# Patient Record
Sex: Female | Born: 1950 | Race: White | Hispanic: No | Marital: Single | Smoking: Former smoker
Health system: Southern US, Community
[De-identification: ages and names within clinical notes are randomized; demographics above are authoritative.]

## PROBLEM LIST (undated history)

## (undated) DIAGNOSIS — I10 Essential (primary) hypertension: Secondary | ICD-10-CM

## (undated) DIAGNOSIS — E785 Hyperlipidemia, unspecified: Secondary | ICD-10-CM

## (undated) DIAGNOSIS — G909 Disorder of the autonomic nervous system, unspecified: Secondary | ICD-10-CM

## (undated) HISTORY — PX: TONSILLECTOMY: SUR1361

## (undated) HISTORY — DX: Essential (primary) hypertension: I10

## (undated) HISTORY — DX: Disorder of the autonomic nervous system, unspecified: G90.9

## (undated) HISTORY — PX: OTHER SURGICAL HISTORY: SHX169

## (undated) HISTORY — DX: Hyperlipidemia, unspecified: E78.5

---

## 2007-01-19 ENCOUNTER — Other Ambulatory Visit: Payer: Self-pay

## 2007-01-19 ENCOUNTER — Emergency Department: Payer: Self-pay | Admitting: Emergency Medicine

## 2008-04-25 ENCOUNTER — Ambulatory Visit: Payer: Self-pay | Admitting: Internal Medicine

## 2008-08-01 ENCOUNTER — Ambulatory Visit: Payer: Self-pay | Admitting: Unknown Physician Specialty

## 2008-09-24 ENCOUNTER — Ambulatory Visit: Payer: Self-pay | Admitting: Unknown Physician Specialty

## 2008-11-05 ENCOUNTER — Ambulatory Visit: Payer: Self-pay | Admitting: Unknown Physician Specialty

## 2008-11-07 ENCOUNTER — Ambulatory Visit: Payer: Self-pay | Admitting: Specialist

## 2008-12-11 ENCOUNTER — Ambulatory Visit: Payer: Self-pay | Admitting: Specialist

## 2009-01-30 ENCOUNTER — Ambulatory Visit: Payer: Self-pay | Admitting: Internal Medicine

## 2009-03-13 ENCOUNTER — Ambulatory Visit: Payer: Self-pay | Admitting: Specialist

## 2009-11-13 ENCOUNTER — Ambulatory Visit: Payer: Self-pay | Admitting: Specialist

## 2010-04-09 ENCOUNTER — Ambulatory Visit: Payer: Self-pay | Admitting: Internal Medicine

## 2010-08-22 IMAGING — CT CT CHEST W/ CM
1 series · 15 of 31 positions shown, 19 images · non-contrast
Comparison: none

REASON FOR EXAM: Pulmonary Nodules
COMMENTS:

[Series 2: soft tissue · axial · 0.65mm/px · z∈[-154,+171]mm · 15 of 71 slices shown, 19 images]
[im 3/71  mediastinal]
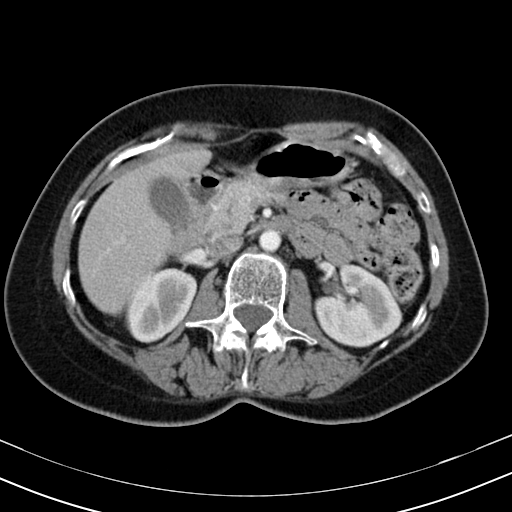
[im 3/71  lung]
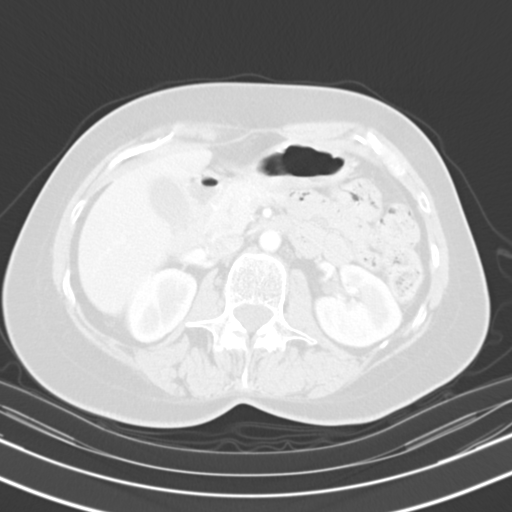
[im 8/71  lung]
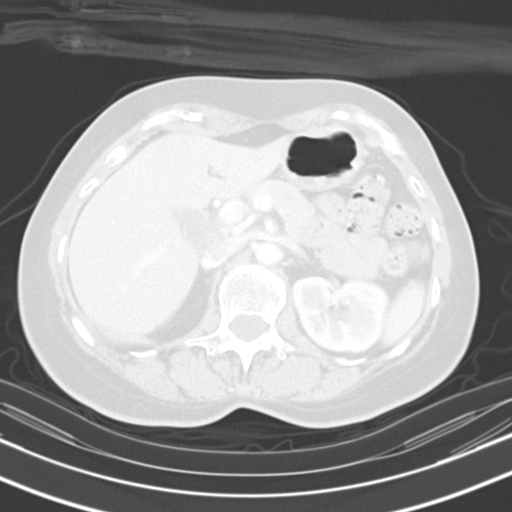
[im 13/71  lung]
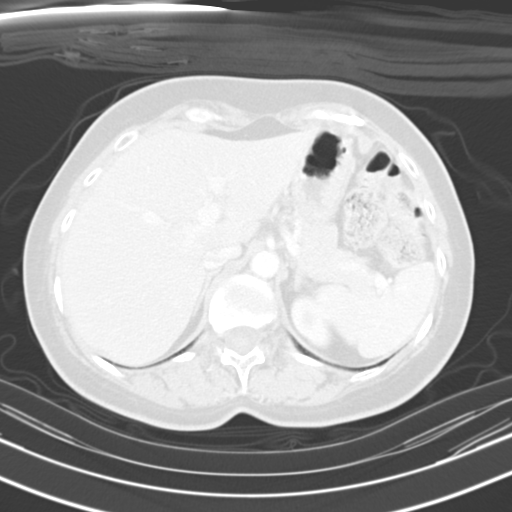
[im 16/71  lung]
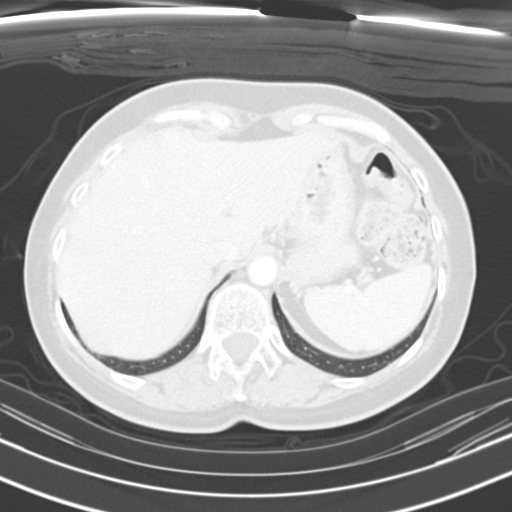
[im 21/71  mediastinal]
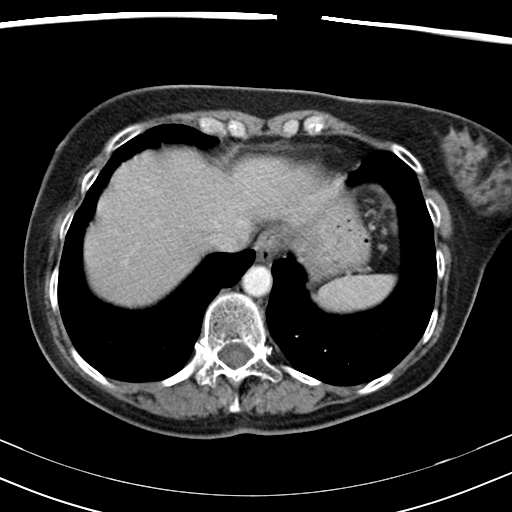
[im 21/71  lung]
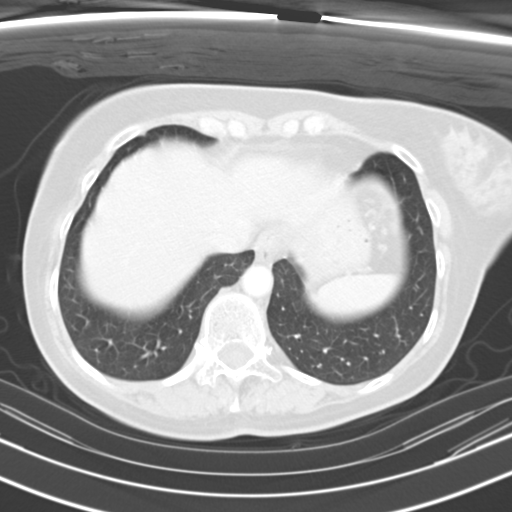
[im 26/71  lung]
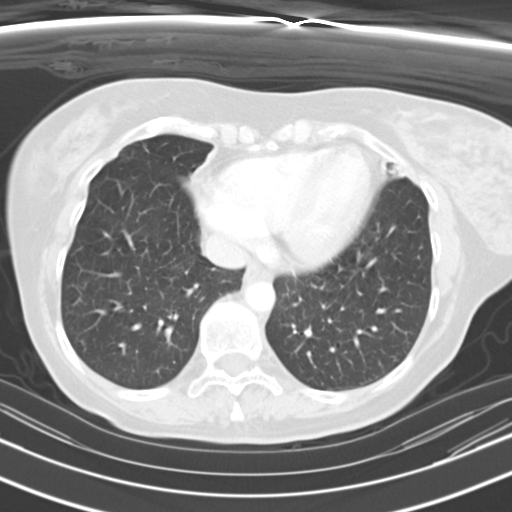
[im 32/71  lung]
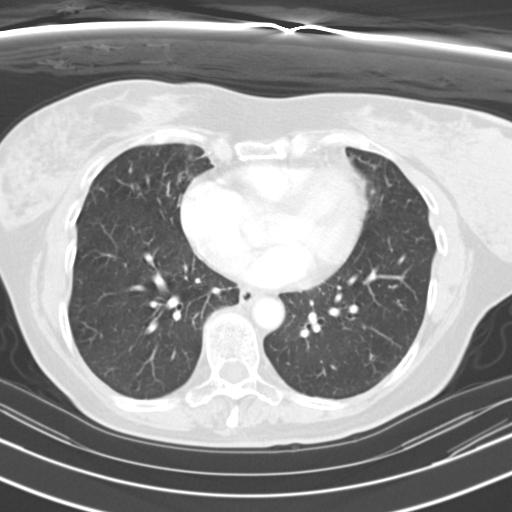
[im 37/71  lung]
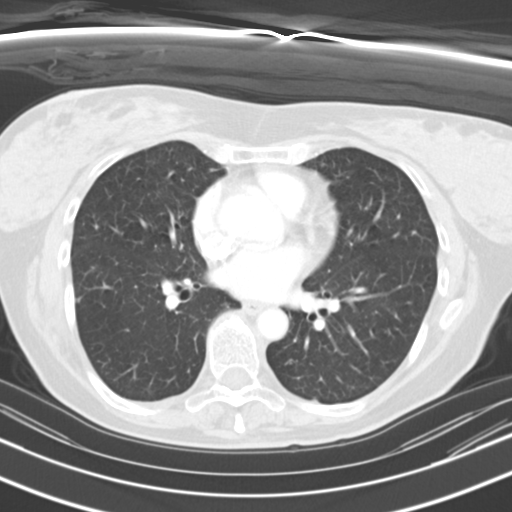
[im 42/71  mediastinal]
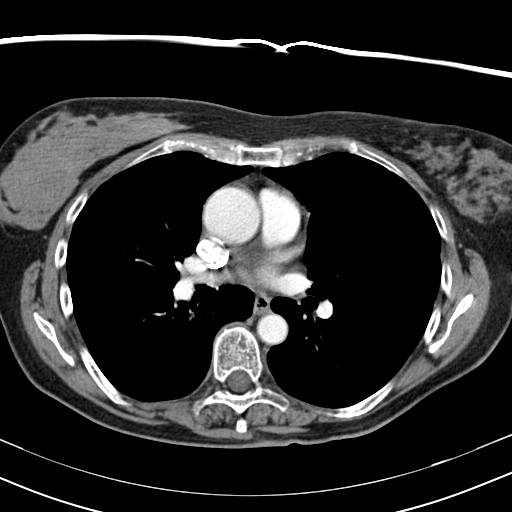
[im 42/71  lung]
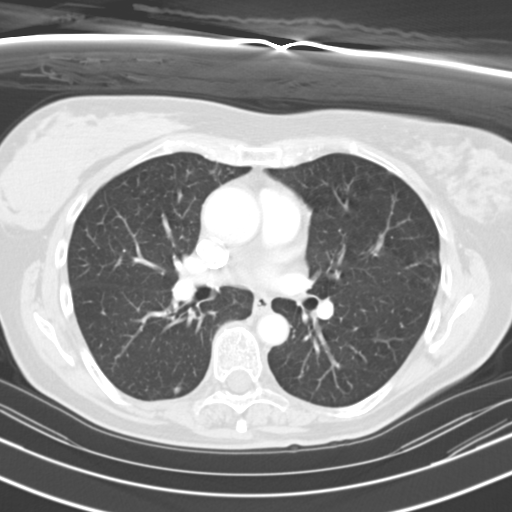
[im 45/71  lung]
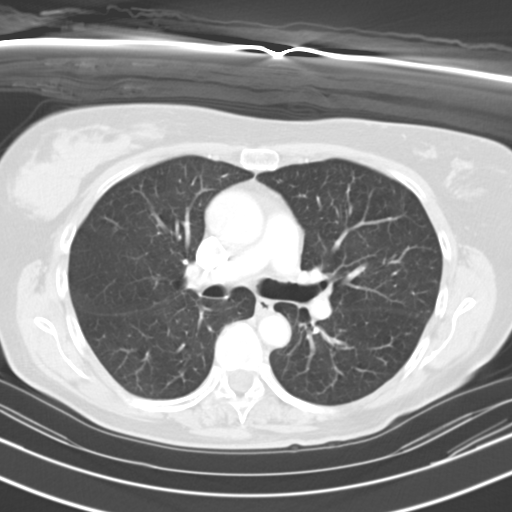
[im 50/71  lung]
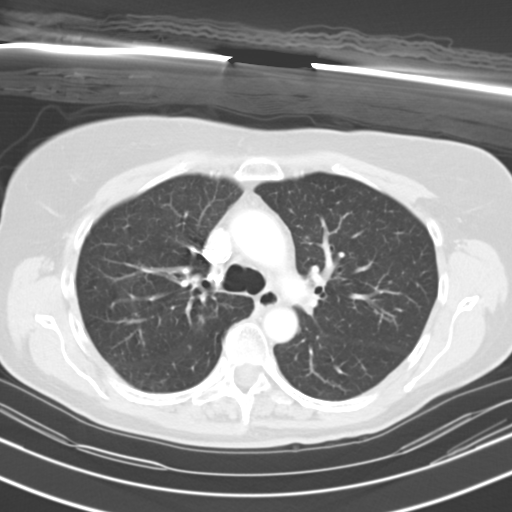
[im 55/71  lung]
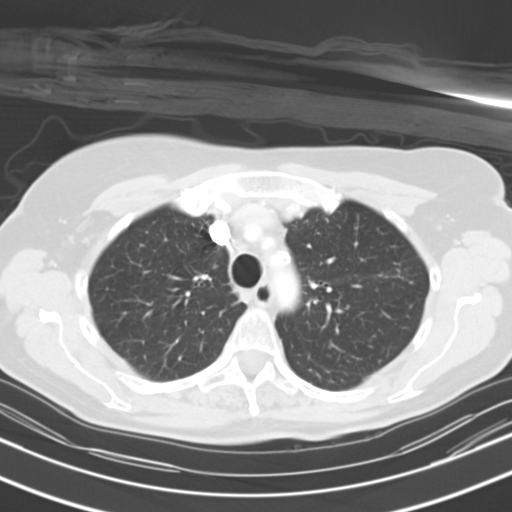
[im 58/71  mediastinal]
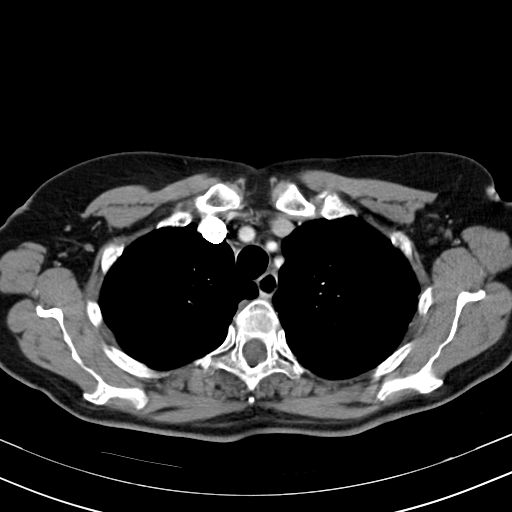
[im 58/71  lung]
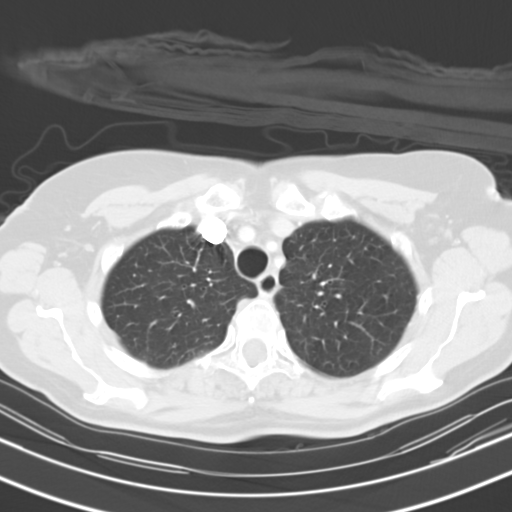
[im 63/71  lung]
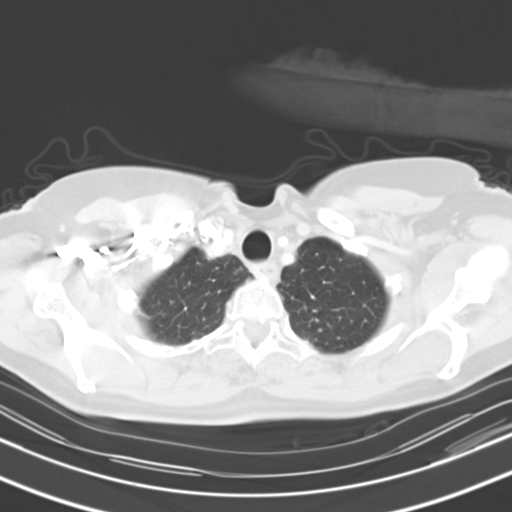
[im 68/71  lung]
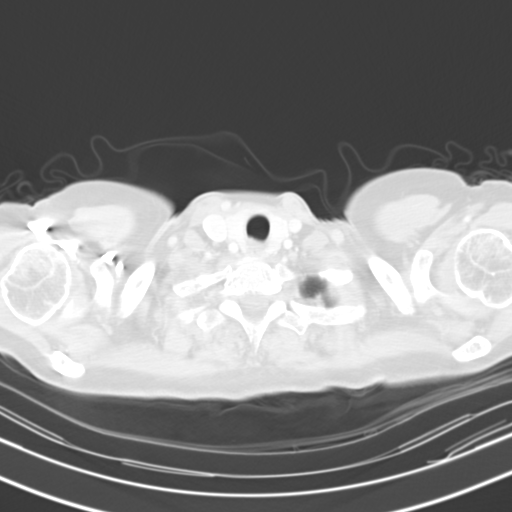

[15 of 31 positions shown; findings below may reference images not displayed]

PROCEDURE:     MIMI - MIMI CHEST WITH CONTRAST  - November 13, 2009 [DATE]

RESULT:     Axial CT scanning was performed through the chest at 5 mm
intervals and slice thicknesses following intravenous administration of 75
cc of 2sovue-2LO. Review of 3-dimensional reconstructed images was performed
separately on the [employer] monitor. Comparison is made to the study

At lung window settings on image 39 there is a stable tiny subpleural nodule
in the left lower lobe. A second nodule on image 48 in the left lower lobe
laterally is present and stable. On the right on image 30 posteriorly in the
right lower lobe there is a stable proximally 5 mm diameter soft tissue
density nodule. There are other subcentimeter nodules present which appear
stable as well. There is no alveolar infiltrate. Minimally increased density
is noted in the medial aspect of the right middle lobe which has appeared
since the previous study. This is seen on images 40 through a 46. This
likely reflects atelectasis.

 The cardiac chambers are normal in size. The caliber of the thoracic aorta
is normal. There is no pleural nor pericardial effusion. I see no pathologic
sized mediastinal or hilar lymph nodes. Within the upper abdomen the
observed portions of the liver and spleen appear normal. There are no
adrenal masses.
IMPRESSION: 1. There is are numerous subcentimeter pulmonary parenchymal nodules
bilaterally which are stable.
2. There is a new area of subtle increased density in the medial aspect of
the right middle lobe which likely reflects atelectasis.
3. I do not see evidence of CHF nor of mediastinal or hilar lymphadenopathy.

## 2012-01-19 ENCOUNTER — Ambulatory Visit: Payer: Self-pay | Admitting: Otolaryngology

## 2012-02-09 ENCOUNTER — Ambulatory Visit: Payer: Self-pay | Admitting: Otolaryngology

## 2020-07-23 ENCOUNTER — Ambulatory Visit (INDEPENDENT_AMBULATORY_CARE_PROVIDER_SITE_OTHER): Payer: Medicare Other | Admitting: Internal Medicine

## 2020-07-23 DIAGNOSIS — G4733 Obstructive sleep apnea (adult) (pediatric): Secondary | ICD-10-CM | POA: Diagnosis not present

## 2020-07-23 DIAGNOSIS — Z7189 Other specified counseling: Secondary | ICD-10-CM | POA: Diagnosis not present

## 2020-07-23 DIAGNOSIS — Z9989 Dependence on other enabling machines and devices: Secondary | ICD-10-CM

## 2020-07-23 NOTE — Patient Instructions (Signed)

## 2020-07-23 NOTE — Progress Notes (Signed)
[facility] [address] [place], [place] [address]  Pulmonary Sleep Medicine   Office Visit Note  Patient Name: Tara Swanson DOB: June 10, 1951 MRN [id number]    Chief Complaint: Obstructive Sleep Apnea visit  Brief History:  Atira is seen today for follow up The patient has a  8 year history of sleep apnea. Patient is using PAP nightly.  The patient feels more rested after sleeping with PAP.  She goes to bed between 11 and 1 a.m. she gets up 7:30 The patient reports benefiging from PAP use. Reported sleepiness is  improved and the Epworth Sleepiness Score is 9 out of 24. The patient will take naps due to her medical condition. The patient complains of the following: discomfort on the bridge of her nose and dry mouth.  The compliance download shows excellent compliance with an average use time of 6.8 hours. The AHI is 3.5  The patient does not complain of limb movements disrupting sleep.  ROS  General: (-) fever, (-) chills, (-) night sweat Nose and Sinuses: (-) nasal stuffiness or itchiness, (-) postnasal drip, (-) nosebleeds, (-) sinus trouble. Mouth and Throat: (-) sore throat, (-) hoarseness. Neck: (-) swollen glands, (-) enlarged thyroid, (-) neck pain. Respiratory: - cough, - shortness of breath, - wheezing. Neurologic: - numbness, - tingling. Psychiatric: - anxiety, - depression   Current Medication: Outpatient Encounter Medications as of 07/23/2020  Medication Sig  . gabapentin (NEURONTIN) 400 MG capsule Take by mouth.  . losartan (COZAAR) 50 MG tablet Take by mouth.  . metaxalone (SKELAXIN) 800 MG tablet Take by mouth.  Marland Kitchen acetaminophen (TYLENOL) 500 MG tablet Take by mouth.  . escitalopram (LEXAPRO) 10 MG tablet   . ezetimibe (ZETIA) 10 MG tablet   . loratadine (CLARITIN) 10 MG tablet Take by mouth.   No facility-administered encounter medications on file as of 07/23/2020.    Surgical History: Past Surgical History:  Procedure Laterality Date  .  deviated septum repair    . TONSILLECTOMY      Medical History: Past Medical History:  Diagnosis Date  . Disorder of parasympathetic nervous system   . HLD (hyperlipidemia)   . HTN (hypertension)     Family History: Non contributory to the present illness  Social History: Social History   Socioeconomic History  . Marital status: Single    Spouse name: Not on file  . Number of children: Not on file  . Years of education: Not on file  . Highest education level: Not on file  Occupational History  . Not on file  Tobacco Use  . Smoking status: Former Smoker    Quit date: 11/01/1997    Years since quitting: 22.7  . Smokeless tobacco: Never Used  Substance and Sexual Activity  . Alcohol use: Not on file  . Drug use: Not on file  . Sexual activity: Not on file  Other Topics Concern  . Not on file  Social History Narrative  . Not on file   Social Determinants of Health   Financial Resource Strain:   . Difficulty of Paying Living Expenses: Not on file  Food Insecurity:   . Worried About Programme researcher, broadcasting/film/video in the Last Year: Not on file  . Ran Out of Food in the Last Year: Not on file  Transportation Needs:   . Lack of Transportation (Medical): Not on file  . Lack of Transportation (Non-Medical): Not on file  Physical Activity:   . Days of Exercise per Week: Not on  file  . Minutes of Exercise per Session: Not on file  Stress:   . Feeling of Stress : Not on file  Social Connections:   . Frequency of Communication with Friends and Family: Not on file  . Frequency of Social Gatherings with Friends and Family: Not on file  . Attends Religious Services: Not on file  . Active Member of Clubs or Organizations: Not on file  . Attends Banker Meetings: Not on file  . Marital Status: Not on file  Intimate Partner Violence:   . Fear of Current or Ex-Partner: Not on file  . Emotionally Abused: Not on file  . Physically Abused: Not on file  . Sexually Abused: Not  on file    Vital Signs: Height 5\' 8"  (1.727 m), weight 174 lb (78.9 kg).  Examination: General Appearance: The patient is well-developed, well-nourished, and in no distress. Neck Circumference: 36 Skin: Gross inspection of skin unremarkable. Head: normocephalic, no gross deformities. Eyes: no gross deformities noted. ENT: ears appear grossly normal Neurologic: Alert and oriented. No involuntary movements.    EPWORTH SLEEPINESS SCALE:  Scale:  (0)= no chance of dozing; (1)= slight chance of dozing; (2)= moderate chance of dozing; (3)= high chance of dozing  Chance  Situtation    Sitting and reading: 2    Watching TV: 2    Sitting Inactive in public: 0    As a passenger in car: 0      Lying down to rest: 3    Sitting and talking: 0    Sitting quielty after lunch: 2    In a car, stopped in traffic: 0   TOTAL SCORE:   9 out of 24    SLEEP STUDIES:  1. PSG 12/2011 AHI 10 SpO24min 82%   CPAP COMPLIANCE DATA:  Date Range: 06/22/20-07/21/20  Average Daily Use: 6.8 hours  Median Use: 6.7  Compliance for > 4 Hours: 100%  AHI: 3.5 respiratory events per hour  Days Used: 30/30  Mask Leak: 2.2  95th Percentile Pressure: 15.6   LABS: No results found for this or any previous visit (from the past 2160 hour(s)).  Radiology: No results found.  Assessment and Plan: Patient Active Problem List   Diagnosis Date Noted  . OSA on CPAP 07/23/2020  . CPAP use counseling 07/23/2020      The patient does tolerate PAP and reports signif benefit from PAP use. The patient is caring for her machine as recommended. She is noticing some discomfort on her nose and is removing the mask at night. She may do well with an F30 or airtouch mask.07/25/2020 She was advised to increase her time in bed.The compliance is excellent. The apnea is controlled.   1. OSA- continue excellent compliance and increase time in bed. 2. CPAP counseling-Discussed importance of CPAP and adequate use  time, discussed proper care and cleaning techniques of CPAP machine and all supplies.  General Counseling: I have discussed the findings of the evaluation and examination with Marland Kitchen.  I have also discussed any further diagnostic evaluation thatmay be needed or ordered today. Chiante verbalizes understanding of the findings of todays visit. We also reviewed her medications today and discussed drug interactions and side effects including but not limited excessive drowsiness and altered mental states. We also discussed that there is always a risk not just to her but also people around her. she has been encouraged to call the office with any questions or concerns that should arise related to todays visit.  I have personally obtained a history, examined the patient, evaluated laboratory and imaging results, formulated the assessment and plan and placed orders.  This patient was seen by Leeanne Deed AGNP-C in Collaboration with Dr. Freda Munro as a part of collaborative care agreement.  Valentino Hue Sol Blazing, PhD, FAASM  Diplomate, American Board of Sleep Medicine    Yevonne Pax, MD [facility] Diplomate ABMS Pulmonary and Critical Care Medicine Sleep medicine

## 2021-07-08 ENCOUNTER — Ambulatory Visit (INDEPENDENT_AMBULATORY_CARE_PROVIDER_SITE_OTHER): Payer: Medicare Other | Admitting: Internal Medicine

## 2021-07-08 DIAGNOSIS — Z7189 Other specified counseling: Secondary | ICD-10-CM | POA: Diagnosis not present

## 2021-07-08 DIAGNOSIS — G4733 Obstructive sleep apnea (adult) (pediatric): Secondary | ICD-10-CM

## 2021-07-08 DIAGNOSIS — F411 Generalized anxiety disorder: Secondary | ICD-10-CM

## 2021-07-08 DIAGNOSIS — I10 Essential (primary) hypertension: Secondary | ICD-10-CM

## 2021-07-08 DIAGNOSIS — Z9989 Dependence on other enabling machines and devices: Secondary | ICD-10-CM

## 2021-07-08 NOTE — Progress Notes (Signed)
[facility] [address] [place], [place] [address]  Pulmonary Sleep Medicine   Office Visit Note  Patient Name: Tara Swanson DOB: 1950/11/12 MRN [id number]  I connected with  Tara Swanson on 07/08/21 by a video enabled telemedicine application and verified that I am speaking with the correct person using two identifiers.   I discussed the limitations of evaluation and management by telemedicine. The patient expressed understanding and agreed to proceed.   Chief Complaint: Obstructive Sleep Apnea visit  Brief History:  Tara Swanson is seen today for annual follow up on APAP@10 -16cmH20. The patient has a 9 year history of sleep apnea. Patient is using PAP nightly.  The patient feels rested after sleeping with PAP.  The patient reports benefit from PAP use. Reported sleepiness is  improved and the Epworth Sleepiness Score is 4 out of 24. The patient occasionally take naps. The patient complains of the following: none, figured out mask fit now.  The compliance download shows 100% compliance with an average use time of 8.5 hours. The AHI is 4.2  The patient does not complain of limb movements disrupting sleep.  ROS  General: (-) fever, (-) chills, (-) night sweat Nose and Sinuses: (-) nasal stuffiness or itchiness, (-) postnasal drip, (-) nosebleeds, (-) sinus trouble. Mouth and Throat: (-) sore throat, (-) hoarseness. Neck: (-) swollen glands, (-) enlarged thyroid, (-) neck pain. Respiratory: + cough, - shortness of breath, - wheezing. Neurologic: + numbness, + tingling. Psychiatric: - anxiety, - depression   Current Medication: Outpatient Encounter Medications as of 07/08/2021  Medication Sig   rosuvastatin (CRESTOR) 5 MG tablet Take by mouth.   acetaminophen (TYLENOL) 500 MG tablet Take by mouth.   escitalopram (LEXAPRO) 10 MG tablet    ezetimibe (ZETIA) 10 MG tablet    gabapentin (NEURONTIN) 400 MG capsule Take by mouth.   loratadine (CLARITIN) 10 MG tablet Take  by mouth.   losartan (COZAAR) 50 MG tablet Take by mouth.   metaxalone (SKELAXIN) 800 MG tablet Take by mouth.   No facility-administered encounter medications on file as of 07/08/2021.    Surgical History: Past Surgical History:  Procedure Laterality Date   deviated septum repair     TONSILLECTOMY      Medical History: Past Medical History:  Diagnosis Date   Disorder of parasympathetic nervous system    HLD (hyperlipidemia)    HTN (hypertension)     Family History: Non contributory to the present illness  Social History: Social History   Socioeconomic History   Marital status: Single    Spouse name: Not on file   Number of children: Not on file   Years of education: Not on file   Highest education level: Not on file  Occupational History   Not on file  Tobacco Use   Smoking status: Former    Types: Cigarettes    Quit date: 11/01/1997    Years since quitting: 23.6   Smokeless tobacco: Never  Substance and Sexual Activity   Alcohol use: Not on file   Drug use: Not on file   Sexual activity: Not on file  Other Topics Concern   Not on file  Social History Narrative   Not on file   Social Determinants of Health   Financial Resource Strain: Not on file  Food Insecurity: Not on file  Transportation Needs: Not on file  Physical Activity: Not on file  Stress: Not on file  Social Connections: Not on file  Intimate Partner Violence: Not on file  Vital Signs: There were no vitals taken for this visit.  Examination: General Appearance: The patient is well-developed, well-nourished, and in no distress. Neck Circumference: 36 Skin: Gross inspection of skin unremarkable. Head: normocephalic, no gross deformities. Eyes: no gross deformities noted. ENT: ears appear grossly normal Neurologic: Alert and oriented. No involuntary movements.    EPWORTH SLEEPINESS SCALE:  Scale:  (0)= no chance of dozing; (1)= slight chance of dozing; (2)= moderate chance of  dozing; (3)= high chance of dozing  Chance  Situtation    Sitting and reading: 1    Watching TV: 1    Sitting Inactive in public: 0    As a passenger in car: 0      Lying down to rest: 1    Sitting and talking: 0    Sitting quielty after lunch: 1    In a car, stopped in traffic: 0   TOTAL SCORE:   4 out of 24    SLEEP STUDIES:  PSG 3/13 AHI 10 Spo25min 93% CPAP Titration 4/13   CPAP COMPLIANCE DATA:  Date Range: 06/02/21-07/01/21  Average Daily Use: 8.5 hours  Median Use: 8.6  Compliance for > 4 Hours: 100%   AHI: 4.2 respiratory events per hour  Days Used: 30/30  Mask Leak: 1.6  95th Percentile Pressure: APAP 10-16         LABS: No results found for this or any previous visit (from the past 2160 hour(s)).  Radiology: No results found.  No results found.  No results found.    Assessment and Plan: Patient Active Problem List   Diagnosis Date Noted   OSA on CPAP 07/23/2020   CPAP use counseling 07/23/2020      The patient does tolerate PAP and reports benefit from PAP use. The patient was reminded how to adjust mask fit and advised to change supplies regularly. The patient was also counselled on nightly use. The compliance is excellent. The AHI is 4.1.   1. OSA on CPAP Continue excellent compliance  2. CPAP use counseling CPAP couseling-Discussed importance of adequate CPAP use as well as proper care and cleaning techniques of machine and all supplies.  3. Essential hypertension Continue current medication and f/u with PCP.  4. GAD (generalized anxiety disorder) Well controlled, continue current medication   General Counseling: I have discussed the findings of the evaluation and examination with Tara Swanson.  I have also discussed any further diagnostic evaluation thatmay be needed or ordered today. Tara Swanson verbalizes understanding of the findings of todays visit. We also reviewed her medications today and discussed drug  interactions and side effects including but not limited excessive drowsiness and altered mental states. We also discussed that there is always a risk not just to her but also people around her. she has been encouraged to call the office with any questions or concerns that should arise related to todays visit.  No orders of the defined types were placed in this encounter.       I have personally obtained a history, examined the patient, evaluated laboratory and imaging results, formulated the assessment and plan and placed orders.  This patient was seen by Lynn Ito, PA-C in collaboration with Dr. Freda Munro as a part of collaborative care agreement.   Valentino Hue Sol Blazing, PhD, FAASM  Diplomate, American Board of Sleep Medicine    Yevonne Pax, MD [facility] Diplomate ABMS Pulmonary and Critical Care Medicine Sleep medicine

## 2021-07-08 NOTE — Patient Instructions (Signed)

## 2022-07-06 NOTE — Progress Notes (Signed)
[facility] [place], [place] [address]  Pulmonary Sleep Medicine   Office Visit Note  Patient Name: Junior Guyette DOB: 1950-11-05 MRN [id number]  I connected with  Eloise Levels on 07/07/22 by a video enabled telemedicine application and verified that I am speaking with the correct person using two identifiers.   I discussed the limitations of evaluation and management by telemedicine. The patient expressed understanding and agreed to proceed.   Chief Complaint: Obstructive Sleep Apnea visit  Brief History:  Alyssamae is seen today for an annual follow up visit for APAP@ 10-16 cmH2O. The patient has a 10 year history of sleep apnea. Patient is using PAP nightly.  The patient feels rested after sleeping with PAP.  The patient reports benefiting from PAP use. Reported sleepiness is  improved and the Epworth Sleepiness Score is 4 out of 24. The patient occasionally takes naps. The patient complains of the following: occasional mask adjustment for leaks.  The compliance download shows 93% compliance with an average use time of 7 hours 20 minutes. The AHI is 3.8.  The patient does not complain of limb movements disrupting sleep. The patient continues to require PAP therapy in order to eliminate her sleep apnea.   ROS  General: (-) fever, (-) chills, (-) night sweat Nose and Sinuses: (-) nasal stuffiness or itchiness, (-) postnasal drip, (-) nosebleeds, (-) sinus trouble. Mouth and Throat: (-) sore throat, (-) hoarseness. Neck: (-) swollen glands, (-) enlarged thyroid, (-) neck pain. Respiratory: + cough, - shortness of breath, - wheezing. Neurologic: - numbness, - tingling. Psychiatric: - anxiety, - depression   Current Medication: Outpatient Encounter Medications as of 07/07/2022  Medication Sig   azelastine (ASTELIN) 0.1 % nasal spray SPRAY TWO SPRAYS IN EACH NOSTRIL TWICE DAILY   acetaminophen (TYLENOL) 500 MG tablet Take by mouth.   escitalopram  (LEXAPRO) 10 MG tablet    ezetimibe (ZETIA) 10 MG tablet    gabapentin (NEURONTIN) 400 MG capsule Take by mouth.   loratadine (CLARITIN) 10 MG tablet Take by mouth.   losartan (COZAAR) 50 MG tablet Take by mouth.   metaxalone (SKELAXIN) 800 MG tablet Take by mouth.   rosuvastatin (CRESTOR) 5 MG tablet Take by mouth.   No facility-administered encounter medications on file as of 07/07/2022.    Surgical History: Past Surgical History:  Procedure Laterality Date   deviated septum repair     TONSILLECTOMY      Medical History: Past Medical History:  Diagnosis Date   Disorder of parasympathetic nervous system    HLD (hyperlipidemia)    HTN (hypertension)     Family History: Non contributory to the present illness  Social History: Social History   Socioeconomic History   Marital status: Single    Spouse name: Not on file   Number of children: Not on file   Years of education: Not on file   Highest education level: Not on file  Occupational History   Not on file  Tobacco Use   Smoking status: Former    Types: Cigarettes    Quit date: 11/01/1997    Years since quitting: 24.6   Smokeless tobacco: Never  Substance and Sexual Activity   Alcohol use: Not on file   Drug use: Not on file   Sexual activity: Not on file  Other Topics Concern   Not on file  Social History Narrative   Not on file   Social Determinants of Health   Financial Resource Strain: Not on file  Food Insecurity: Not on file  Transportation Needs: Not on file  Physical Activity: Not on file  Stress: Not on file  Social Connections: Not on file  Intimate Partner Violence: Not on file    Vital Signs: Height 5\' 8"  (1.727 m), weight 179 lb (81.2 kg). Body mass index is 27.22 kg/m.    Examination: General Appearance: The patient is well-developed, well-nourished, and in no distress. Neck Circumference: 36 cm Skin: Gross inspection of skin unremarkable. Head: normocephalic, no gross  deformities. Eyes: no gross deformities noted. ENT: ears appear grossly normal Neurologic: Alert and oriented. No involuntary movements.  STOP BANG RISK ASSESSMENT S (snore) Have you been told that you snore?     NO   T (tired) Are you often tired, fatigued, or sleepy during the day?   NO  O (obstruction) Do you stop breathing, choke, or gasp during sleep? NO   P (pressure) Do you have or are you being treated for high blood pressure? YES   B (BMI) Is your body index greater than 35 kg/m? NO   A (age) Are you 72 years old or older? YES   N (neck) Do you have a neck circumference greater than 16 inches?   NO   G (gender) Are you a female? NO   TOTAL STOP/BANG "YES" ANSWERS 2       A STOP-Bang score of 2 or less is considered low risk, and a score of 5 or more is high risk for having either moderate or severe OSA. For people who score 3 or 4, doctors may need to perform further assessment to determine how likely they are to have OSA.         EPWORTH SLEEPINESS SCALE:  Scale:  (0)= no chance of dozing; (1)= slight chance of dozing; (2)= moderate chance of dozing; (3)= high chance of dozing  Chance  Situtation    Sitting and reading: 1    Watching TV: 1    Sitting Inactive in public: 0    As a passenger in car: 0      Lying down to rest: 1    Sitting and talking: 0    Sitting quielty after lunch: 1    In a car, stopped in traffic: 0   TOTAL SCORE:   4 out of 24    SLEEP STUDIES:  PSG (12/2011) AHI 10/hr, Supine AHI 28.5/hr, min SpO2 82% Titration (01/2012) CPAP@ 5 cmH2O   CPAP COMPLIANCE DATA:  Date Range: 07/04/2021-07/03/2022  Average Daily Use: 7 hours 20 minutes  Median Use: 7 hours 30 minutes  Compliance for > 4 Hours: 93%  AHI: 3.8 respiratory events per hour  Days Used: 355/365 days  Mask Leak: 3  95th Percentile Pressure: 15.6         LABS: No results found for this or any previous visit (from the past 2160  hour(s)).  Radiology: No results found.  No results found.  No results found.    Assessment and Plan: Patient Active Problem List   Diagnosis Date Noted   OSA on CPAP 07/23/2020   CPAP use counseling 07/23/2020      The patient does tolerate PAP and reports benefit from PAP use. The patient was reminded how to adjust mask fit and advised to change supplies regularly. The patient was also counselled on nightly use. The compliance is excellent. The AHI is 3.5. Pt continues to require cpap to treat her Osa and is medically necessary.   1. OSA on  CPAP Continue excellent compliance  2. CPAP use counseling CPAP couseling-Discussed importance of adequate CPAP use as well as proper care and cleaning techniques of machine and all supplies.  3. Essential hypertension Continue current medication and f/u with PCP.  4. GAD (generalized anxiety disorder) Continue current medication and f/u with PCP.    General Counseling: I have discussed the findings of the evaluation and examination with [name].  I have also discussed any further diagnostic evaluation thatmay be needed or ordered today. Amica verbalizes understanding of the findings of todays visit. We also reviewed her medications today and discussed drug interactions and side effects including but not limited excessive drowsiness and altered mental states. We also discussed that there is always a risk not just to her but also people around her. she has been encouraged to call the office with any questions or concerns that should arise related to todays visit.  No orders of the defined types were placed in this encounter.       I have personally obtained a history, examined the patient, evaluated laboratory and imaging results, formulated the assessment and plan and placed orders.  This patient was seen by Lynn Ito, PA-C in collaboration with Dr. Freda Munro as a part of collaborative care agreement.  Yevonne Pax,  MD [facility] Diplomate ABMS Pulmonary Critical Care Medicine and Sleep Medicine

## 2022-07-07 ENCOUNTER — Ambulatory Visit (INDEPENDENT_AMBULATORY_CARE_PROVIDER_SITE_OTHER): Payer: Medicare Other | Admitting: Internal Medicine

## 2022-07-07 VITALS — Ht 68.0 in | Wt 179.0 lb

## 2022-07-07 DIAGNOSIS — I10 Essential (primary) hypertension: Secondary | ICD-10-CM | POA: Diagnosis not present

## 2022-07-07 DIAGNOSIS — G4733 Obstructive sleep apnea (adult) (pediatric): Secondary | ICD-10-CM

## 2022-07-07 DIAGNOSIS — Z9989 Dependence on other enabling machines and devices: Secondary | ICD-10-CM

## 2022-07-07 DIAGNOSIS — Z7189 Other specified counseling: Secondary | ICD-10-CM

## 2022-07-07 DIAGNOSIS — F411 Generalized anxiety disorder: Secondary | ICD-10-CM

## 2022-07-07 NOTE — Patient Instructions (Signed)

## 2023-07-19 NOTE — Progress Notes (Signed)
[facility] [address] [place], [place] [address]  Pulmonary Sleep Medicine   Office Visit Note  Patient Name: Tara Swanson DOB: Jan 31, 1951 MRN [id number]    Chief Complaint: Obstructive Sleep Apnea visit  Brief History:  Kismet is seen today for an annual follow up visit for APAP@ 10-16 cmH2O. The patient has a 11 year history of sleep apnea. Patient is using PAP nightly.  The patient feels rested after sleeping with PAP.  The patient reports benefiting from PAP use. Reported sleepiness is  improved and the Epworth Sleepiness Score is 2 out of 24. The patient will rarely take naps. The patient complains of the following: none.  Does occasionally pull mask off during the night and is hitting max on APAP range. Will try increasing range to see if this helps. The compliance download shows 94% compliance with an average use time of 7 hours 13 minutes. The AHI is 3.8.  The patient does not complain of limb movements disrupting sleep. The patient continues to require PAP therapy in order to eliminate sleep apnea.   ROS  General: (-) fever, (-) chills, (-) night sweat Nose and Sinuses: (-) nasal stuffiness or itchiness, (-) postnasal drip, (-) nosebleeds, (-) sinus trouble. Mouth and Throat: (-) sore throat, (-) hoarseness. Neck: (-) swollen glands, (-) enlarged thyroid, (-) neck pain. Respiratory: + cough, - shortness of breath, - wheezing. Neurologic: - numbness, - tingling. Psychiatric: - anxiety, - depression   Current Medication: Outpatient Encounter Medications as of 07/20/2023  Medication Sig   acetaminophen (TYLENOL) 500 MG tablet Take by mouth.   azelastine (ASTELIN) 0.1 % nasal spray SPRAY TWO SPRAYS IN EACH NOSTRIL TWICE DAILY   escitalopram (LEXAPRO) 10 MG tablet    ezetimibe (ZETIA) 10 MG tablet    gabapentin (NEURONTIN) 400 MG capsule Take by mouth.   loratadine (CLARITIN) 10 MG tablet Take by mouth.   losartan (COZAAR) 50 MG tablet Take by mouth.    metaxalone (SKELAXIN) 800 MG tablet Take by mouth.   rosuvastatin (CRESTOR) 5 MG tablet Take by mouth.   No facility-administered encounter medications on file as of 07/20/2023.    Surgical History: Past Surgical History:  Procedure Laterality Date   deviated septum repair     TONSILLECTOMY      Medical History: Past Medical History:  Diagnosis Date   Disorder of parasympathetic nervous system    HLD (hyperlipidemia)    HTN (hypertension)     Family History: Non contributory to the present illness  Social History: Social History   Socioeconomic History   Marital status: Single    Spouse name: Not on file   Number of children: Not on file   Years of education: Not on file   Highest education level: Not on file  Occupational History   Not on file  Tobacco Use   Smoking status: Former    Current packs/day: 0.00    Types: Cigarettes    Quit date: 11/01/1997    Years since quitting: 25.7   Smokeless tobacco: Never  Substance and Sexual Activity   Alcohol use: Not on file   Drug use: Not on file   Sexual activity: Not on file  Other Topics Concern   Not on file  Social History Narrative   Not on file   Social Determinants of Health   Financial Resource Strain: Not on file  Food Insecurity: Not on file  Transportation Needs: Not on file  Physical Activity: Not on file  Stress: Not  on file  Social Connections: Not on file  Intimate Partner Violence: Not on file    Vital Signs: Blood pressure (!) 150/81, pulse 62, resp. rate 16, height 5\' 8"  (1.727 m), weight 180 lb (81.6 kg), SpO2 94%. Body mass index is 27.37 kg/m.    Examination: General Appearance: The patient is well-developed, well-nourished, and in no distress. Neck Circumference: 37 cm Skin: Gross inspection of skin unremarkable. Head: normocephalic, no gross deformities. Eyes: no gross deformities noted. ENT: ears appear grossly normal Neurologic: Alert and oriented. No involuntary  movements.  STOP BANG RISK ASSESSMENT S (snore) Have you been told that you snore?     NO   T (tired) Are you often tired, fatigued, or sleepy during the day?   NO  O (obstruction) Do you stop breathing, choke, or gasp during sleep? NO   P (pressure) Do you have or are you being treated for high blood pressure? YES   B (BMI) Is your body index greater than 35 kg/m? NO   A (age) Are you 73 years old or older? YES   N (neck) Do you have a neck circumference greater than 16 inches?   NO   G (gender) Are you a female? NO   TOTAL STOP/BANG "YES" ANSWERS 2       A STOP-Bang score of 2 or less is considered low risk, and a score of 5 or more is high risk for having either moderate or severe OSA. For people who score 3 or 4, doctors may need to perform further assessment to determine how likely they are to have OSA.         EPWORTH SLEEPINESS SCALE:  Scale:  (0)= no chance of dozing; (1)= slight chance of dozing; (2)= moderate chance of dozing; (3)= high chance of dozing  Chance  Situtation    Sitting and reading: 1    Watching TV: 1    Sitting Inactive in public: 0    As a passenger in car: 0      Lying down to rest: 0    Sitting and talking: 0    Sitting quielty after lunch: 0    In a car, stopped in traffic: 0   TOTAL SCORE:   2 out of 24    SLEEP STUDIES:  PSG (12/2011) AHI 10/hr, Supine AHI 28.5/hr, min SpO2 82% Titration (01/2012) CPAP@ 5 cmH2O   CPAP COMPLIANCE DATA:  Date Range: 07/18/2022-07/17/2023  Average Daily Use: 7 hours 13 minutes  Median Use: 7 hours 26 minutes  Compliance for > 4 Hours: 94%  AHI: 3.8 respiratory events per hour  Days Used: 361/365 days  Mask Leak: 2.1  95th Percentile Pressure: 15.7         LABS: No results found for this or any previous visit (from the past 2160 hour(s)).  Radiology: No results found.  No results found.  No results found.    Assessment and Plan: Patient Active Problem List    Diagnosis Date Noted   OSA on CPAP 07/23/2020   CPAP use counseling 07/23/2020      The patient does tolerate PAP and reports benefit from PAP use. The patient was reminded how to adjust mask fit and advised to change supplies regularly. The patient was also counselled on nightly use. The compliance is excellent. The AHI is 3.8 and variable. Will raise APAP 10-20 due to hitting max on current range. Will check 2 week remote download to ensure still controlled. Patient continues to require  PAP to treat their apnea and is medically necessary.   1. OSA on CPAP Continue excellent compliance  2. CPAP use counseling CPAP couseling-Discussed importance of adequate CPAP use as well as proper care and cleaning techniques of machine and all supplies.  3. Essential hypertension Continue current medication and f/u with PCP.  4. GAD (generalized anxiety disorder) Continue current medication and f/u with PCP.    General Counseling: I have discussed the findings of the evaluation and examination with [name].  I have also discussed any further diagnostic evaluation thatmay be needed or ordered today. Shanaiya verbalizes understanding of the findings of todays visit. We also reviewed her medications today and discussed drug interactions and side effects including but not limited excessive drowsiness and altered mental states. We also discussed that there is always a risk not just to her but also people around her. she has been encouraged to call the office with any questions or concerns that should arise related to todays visit.  No orders of the defined types were placed in this encounter.       I have personally obtained a history, examined the patient, evaluated laboratory and imaging results, formulated the assessment and plan and placed orders.  This patient was seen by Lynn Ito, PA-C in collaboration with Dr. Freda Munro as a part of collaborative care agreement.  Yevonne Pax, MD  [facility] Diplomate ABMS Pulmonary Critical Care Medicine and Sleep Medicine

## 2023-07-20 ENCOUNTER — Ambulatory Visit (INDEPENDENT_AMBULATORY_CARE_PROVIDER_SITE_OTHER): Payer: Medicare Other | Admitting: Internal Medicine

## 2023-07-20 VITALS — BP 150/81 | HR 62 | Resp 16 | Ht 68.0 in | Wt 180.0 lb

## 2023-07-20 DIAGNOSIS — I10 Essential (primary) hypertension: Secondary | ICD-10-CM | POA: Diagnosis not present

## 2023-07-20 DIAGNOSIS — G4733 Obstructive sleep apnea (adult) (pediatric): Secondary | ICD-10-CM

## 2023-07-20 DIAGNOSIS — F411 Generalized anxiety disorder: Secondary | ICD-10-CM | POA: Diagnosis not present

## 2023-07-20 DIAGNOSIS — Z7189 Other specified counseling: Secondary | ICD-10-CM | POA: Diagnosis not present

## 2023-07-20 NOTE — Patient Instructions (Signed)

## 2024-07-17 NOTE — Progress Notes (Signed)
 [facility] [address] [place], [place] [address]  Pulmonary Sleep Medicine   Office Visit Note  Patient Name: Tara Swanson DOB: 02/14/1951 MRN [id number]    Chief Complaint: Obstructive Sleep Apnea visit  Brief History:  Tara Swanson is seen today for an annual follow up on APAP @ 10-20 cmH2O. The patient has a 12 year history of sleep apnea. Patient is using PAP nightly.  The patient feels somewhat rested after sleeping with PAP. Patient attributes this to another medical condition. The patient reports benefit from PAP use. Reported sleepiness is improved and the Epworth Sleepiness Score is 12 out of 24. The patient does take naps occasionally but mostly reports short naps especially after reading or after a meal when she may doze off. These naps are without CPAP. Encouraged patient to use CPAP during planned naps. The patient complains of the following: mask has become uncomfortable and leaks also, she has scheduled mask fitting appointment. She mentions she is having more trouble with her sleep this year than last year. The compliance download shows  91% compliance with an average use time of 6 hours 39 minutes. The AHI is 3.1  The patient does not complain of limb movements disrupting sleep. Patient's machine is over 73 years old, out of warranty and obsolete for repair. To assure reliable treatment for OSA, a replacement is needed.  ROS  General: (-) fever, (-) chills, (-) night sweat Nose and Sinuses: (-) nasal stuffiness or itchiness, (-) postnasal drip, (-) nosebleeds, (-) sinus trouble. Mouth and Throat: (-) sore throat, (-) hoarseness. Neck: (-) swollen glands, (-) enlarged thyroid, (-) neck pain. Respiratory: + cough, - shortness of breath, - wheezing. Neurologic: + numbness, + tingling. Psychiatric: + anxiety, - depression   Current Medication: Outpatient Encounter Medications as of 07/18/2024  Medication Sig   escitalopram (LEXAPRO) 10 MG tablet Take 10 mg  by mouth.   escitalopram (LEXAPRO) 5 MG tablet Take 5 mg by mouth every evening.   gabapentin (NEURONTIN) 100 MG capsule Take 1 capsule by mouth 4x daily with 600mg  dose for total 700mg    gabapentin (NEURONTIN) 600 MG tablet Take 600 mg by mouth.   acetaminophen (TYLENOL) 500 MG tablet Take by mouth.   azelastine (ASTELIN) 0.1 % nasal spray SPRAY TWO SPRAYS IN EACH NOSTRIL TWICE DAILY   ezetimibe (ZETIA) 10 MG tablet    loratadine (CLARITIN) 10 MG tablet Take by mouth.   losartan (COZAAR) 50 MG tablet Take by mouth.   metaxalone (SKELAXIN) 800 MG tablet Take by mouth.   rosuvastatin (CRESTOR) 5 MG tablet Take by mouth.   [DISCONTINUED] escitalopram (LEXAPRO) 10 MG tablet    [DISCONTINUED] gabapentin (NEURONTIN) 400 MG capsule Take by mouth.   No facility-administered encounter medications on file as of 07/18/2024.    Surgical History: Past Surgical History:  Procedure Laterality Date   deviated septum repair     TONSILLECTOMY      Medical History: Past Medical History:  Diagnosis Date   Disorder of parasympathetic nervous system    HLD (hyperlipidemia)    HTN (hypertension)     Family History: Non contributory to the present illness  Social History: Social History   Socioeconomic History   Marital status: Single    Spouse name: Not on file   Number of children: Not on file   Years of education: Not on file   Highest education level: Not on file  Occupational History   Not on file  Tobacco Use   Smoking status:  Former    Current packs/day: 0.00    Types: Cigarettes    Quit date: 11/01/1997    Years since quitting: 26.7   Smokeless tobacco: Never  Substance and Sexual Activity   Alcohol use: Not on file   Drug use: Not on file   Sexual activity: Not on file  Other Topics Concern   Not on file  Social History Narrative   Not on file   Social Drivers of Health   Financial Resource Strain: Low Risk  (02/21/2024)   Received from [facility]    Overall Financial Resource Strain (CARDIA)    Difficulty of Paying Living Expenses: Not very hard  Food Insecurity: No Food Insecurity (02/21/2024)   Received from [facility]   Hunger Vital Sign    Within the past 12 months, you worried that your food would run out before you got the money to buy more.: Never true    Within the past 12 months, the food you bought just didn't last and you didn't have money to get more.: Never true  Transportation Needs: No Transportation Needs (02/21/2024)   Received from [facility]   PRAPARE - Transportation    Lack of Transportation (Non-Medical): No    In the past 12 months, has lack of transportation kept you from medical appointments or from getting medications?: No  Physical Activity: Not on file  Stress: Not on file  Social Connections: Not on file  Intimate Partner Violence: Not on file    Vital Signs: Blood pressure (!) 143/82, pulse 73, resp. rate 16, height 5' 8 (1.727 m), weight 179 lb 6.4 oz (81.4 kg), SpO2 94%. Body mass index is 27.28 kg/m.    Examination: General Appearance: The patient is well-developed, well-nourished, and in no distress. Neck Circumference: 38 cm Skin: Gross inspection of skin unremarkable. Head: normocephalic, no gross deformities. Eyes: no gross deformities noted. ENT: ears appear grossly normal Neurologic: Alert and oriented. No involuntary movements.  STOP BANG RISK ASSESSMENT S (snore) Have you been told that you snore?     NO   T (tired) Are you often tired, fatigued, or sleepy during the day?   YES  O (obstruction) Do you stop breathing, choke, or gasp during sleep? NO   P (pressure) Do you have or are you being treated for high blood pressure? YES   B (BMI) Is your body index greater than 35 kg/m? NO   A (age) Are you 39 years old or older? YES   N (neck) Do you have a neck circumference greater than 16 inches?   NO   G (gender) Are you a female? NO    TOTAL STOP/BANG "YES" ANSWERS 3       A STOP-Bang score of 2 or less is considered low risk, and a score of 5 or more is high risk for having either moderate or severe OSA. For people who score 3 or 4, doctors may need to perform further assessment to determine how likely they are to have OSA.         EPWORTH SLEEPINESS SCALE:  Scale:  (0)= no chance of dozing; (1)= slight chance of dozing; (2)= moderate chance of dozing; (3)= high chance of dozing  Chance  Situtation    Sitting and reading: 3    Watching TV: 2    Sitting Inactive in public: 1    As a passenger in car: 1      Lying down to  rest: 2    Sitting and talking: 0    Sitting quielty after lunch: 2    In a car, stopped in traffic: N/A    TOTAL SCORE:   12 out of 24    SLEEP STUDIES:  PSG (12/2011) AHI 10/hr, Supine AHI 28.5/Hr, min Sp02 82% Titration (01/2012) CPAP @ 5 cmH2O   CPAP COMPLIANCE DATA:  Date Range: 07/15/2023 - 07/13/2024  Average Daily Use: 6 hours 41 minutes  Median Use: 6 hours 56 minutes  Compliance for > 4 Hours: 91%  days  AHI: 3.1 respiratory events per hour  Days Used: 363/365  Mask Leak: 2.0  95th Percentile Pressure: 19.0         LABS: No results found for this or any previous visit (from the past 2160 hours).  Radiology: No results found.  No results found.  No results found.    Assessment and Plan: Patient Active Problem List   Diagnosis Date Noted   OSA on CPAP 07/23/2020   CPAP use counseling 07/23/2020      The patient does tolerate PAP and reports benefit from PAP use. The patient was reminded how to adjust mask fit and advised to change supplies regularly. The patient was also counselled on nightly use. The compliance is excellent. The AHI is 3.1. Patient continues to require PAP to treat their apnea and is medically necessary. The patient's machine is past end of life and must be replaced. Patient is going to have mask fitting at new machine  setup as well.    1. OSA on CPAP (Primary) continue excellent compliance. Follow up 30+ days after set up  2. CPAP use counseling CPAP couseling-Discussed importance of adequate CPAP use as well as proper care and cleaning techniques of machine and all supplies.  3. Essential hypertension Continue current medication and f/u with PCP.  4. GAD (generalized anxiety disorder) Continue current medication and f/u with PCP.    General Counseling: I have discussed the findings of the evaluation and examination with Levada.  I have also discussed any further diagnostic evaluation thatmay be needed or ordered today. Kinzlee verbalizes understanding of the findings of todays visit. We also reviewed her medications today and discussed drug interactions and side effects including but not limited excessive drowsiness and altered mental states. We also discussed that there is always a risk not just to her but also people around her. she has been encouraged to call the office with any questions or concerns that should arise related to todays visit.  No orders of the defined types were placed in this encounter.       I have personally obtained a history, examined the patient, evaluated laboratory and imaging results, formulated the assessment and plan and placed orders.  This patient was seen by Tinnie Pro, PA-C in collaboration with Dr. Elfreda Bathe as a part of collaborative care agreement.  Elfreda DELENA Bathe, MD [facility] Diplomate ABMS Pulmonary Critical Care Medicine and Sleep Medicine

## 2024-07-18 ENCOUNTER — Ambulatory Visit (INDEPENDENT_AMBULATORY_CARE_PROVIDER_SITE_OTHER): Admitting: Internal Medicine

## 2024-07-18 VITALS — BP 143/82 | HR 73 | Resp 16 | Ht 68.0 in | Wt 179.4 lb

## 2024-07-18 DIAGNOSIS — G4733 Obstructive sleep apnea (adult) (pediatric): Secondary | ICD-10-CM | POA: Diagnosis not present

## 2024-07-18 DIAGNOSIS — I10 Essential (primary) hypertension: Secondary | ICD-10-CM | POA: Diagnosis not present

## 2024-07-18 DIAGNOSIS — F411 Generalized anxiety disorder: Secondary | ICD-10-CM

## 2024-07-18 DIAGNOSIS — Z7189 Other specified counseling: Secondary | ICD-10-CM | POA: Diagnosis not present

## 2024-07-18 NOTE — Patient Instructions (Signed)
